# Patient Record
Sex: Male | Born: 2000 | Hispanic: No | Marital: Single | Smoking: Current every day smoker
Health system: Southern US, Community
[De-identification: ages and names within clinical notes are randomized; demographics above are authoritative.]

## PROBLEM LIST (undated history)

## (undated) DIAGNOSIS — L709 Acne, unspecified: Secondary | ICD-10-CM

## (undated) DIAGNOSIS — N200 Calculus of kidney: Secondary | ICD-10-CM

## (undated) HISTORY — DX: Acne, unspecified: L70.9

---
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
## 2020-03-27 ENCOUNTER — Ambulatory Visit (INDEPENDENT_AMBULATORY_CARE_PROVIDER_SITE_OTHER): Payer: Medicaid Other | Admitting: Otolaryngology

## 2021-12-02 IMAGING — US US EXTREM LOW*L* LIMITED
1 series · 14 of 19 positions shown · non-contrast
Comparison: CT 01/12/2019.

CLINICAL DATA: Painful "bulge" in the left groin region for 1 year

EXAM:
ULTRASOUND LEFT LOWER EXTREMITY LIMITED
TECHNIQUE: Ultrasound examination of the lower extremity soft tissues was
performed in the area of clinical concern.

[Series 1: us extrem low*left* limited · 19 acquisitions, 14 frames shown]
[im 1/19]
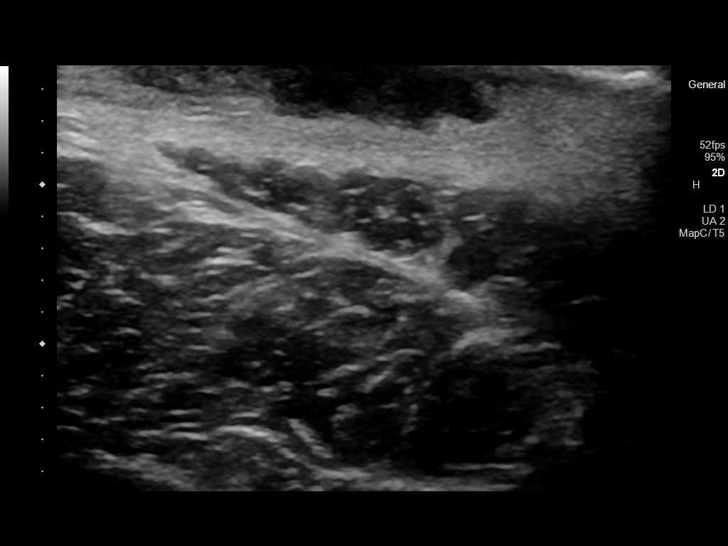
[im 3/19]
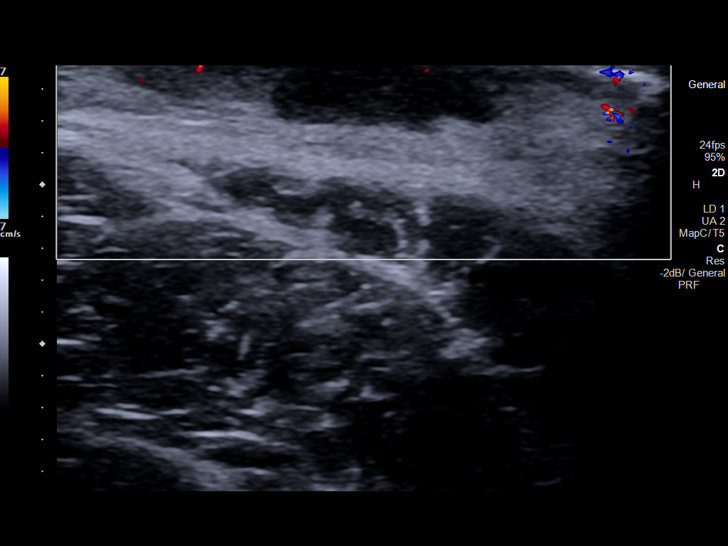
[im 4/19]
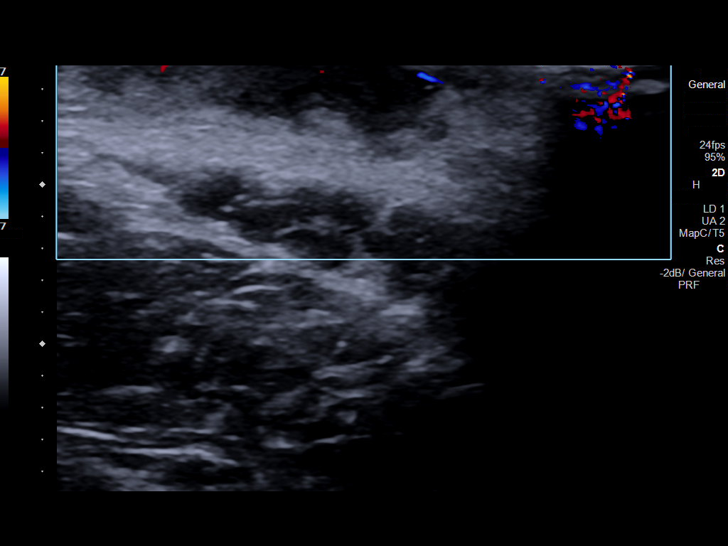
[im 5/19]
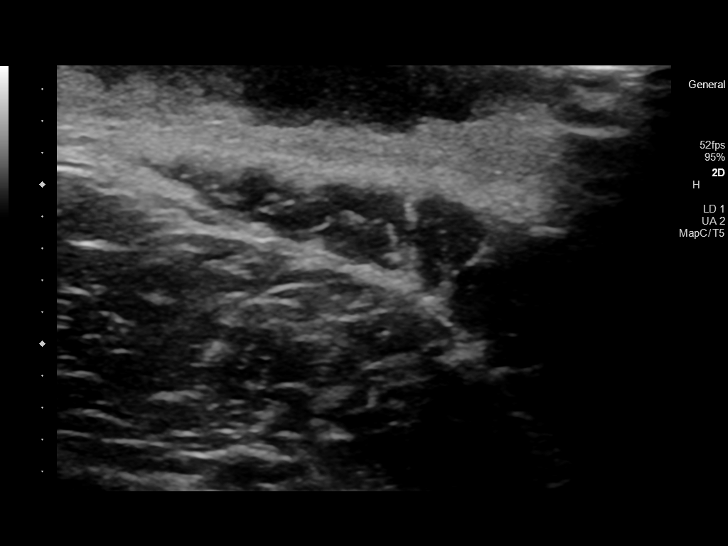
[im 7/19]
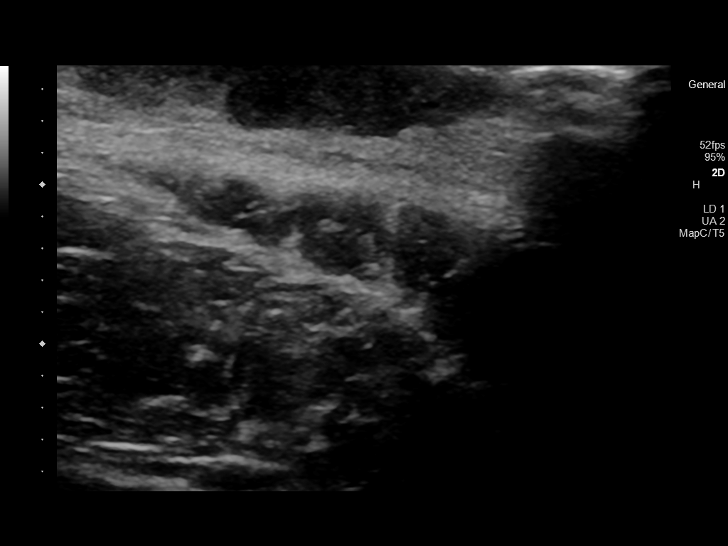
[im 8/19]
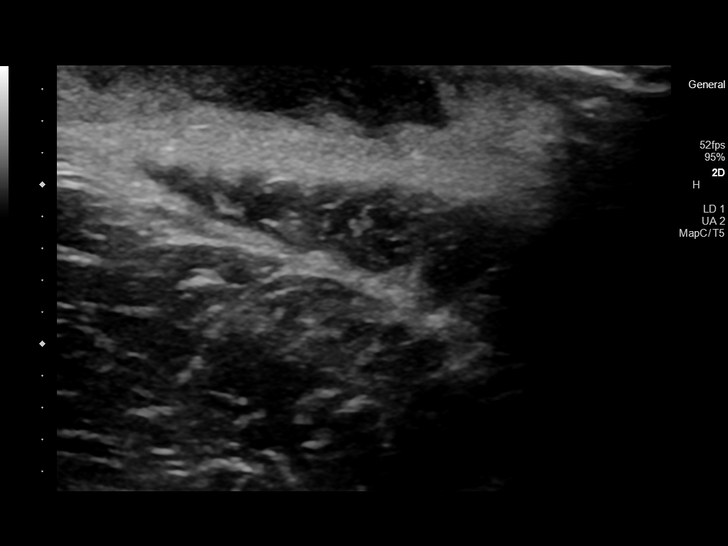
[im 9/19]
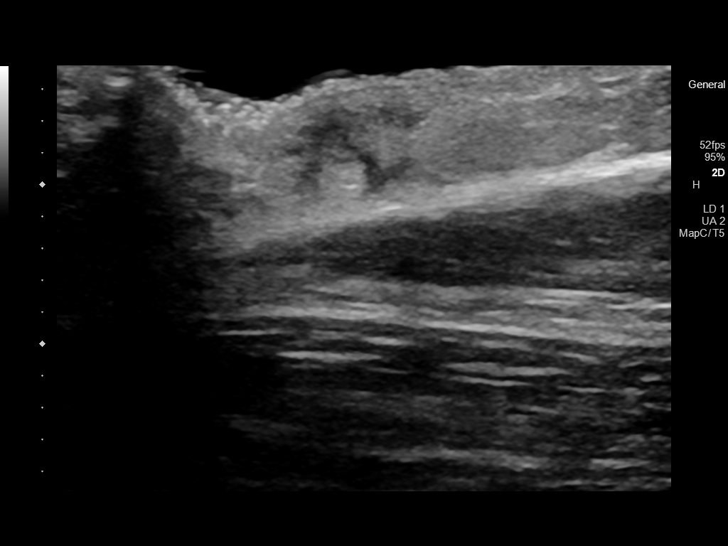
[im 11/19]
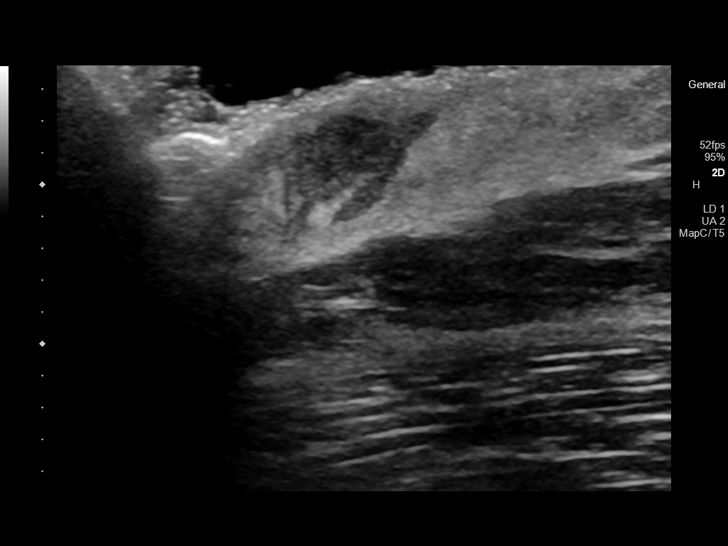
[im 12/19]
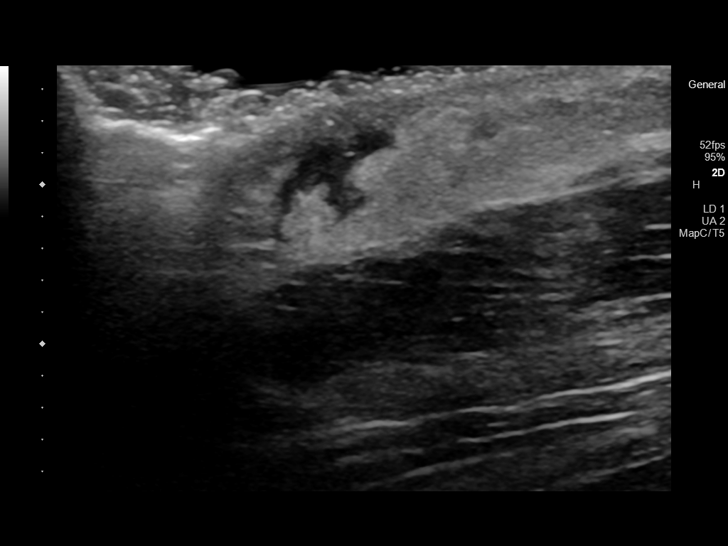
[im 13/19]
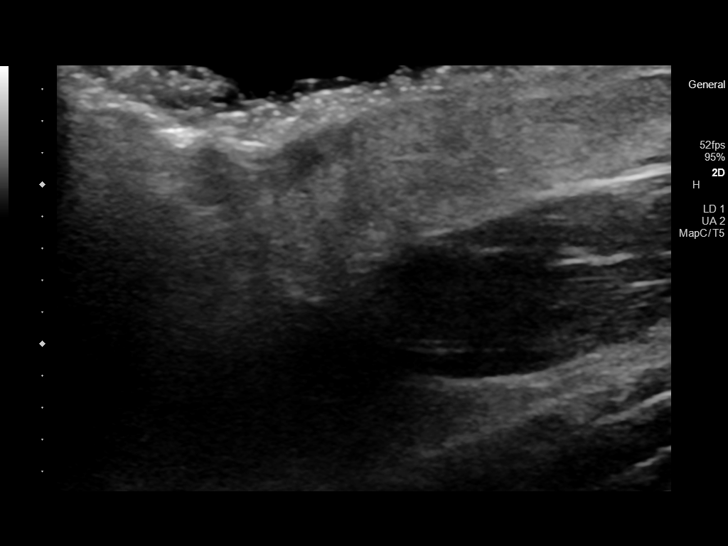
[im 15/19]
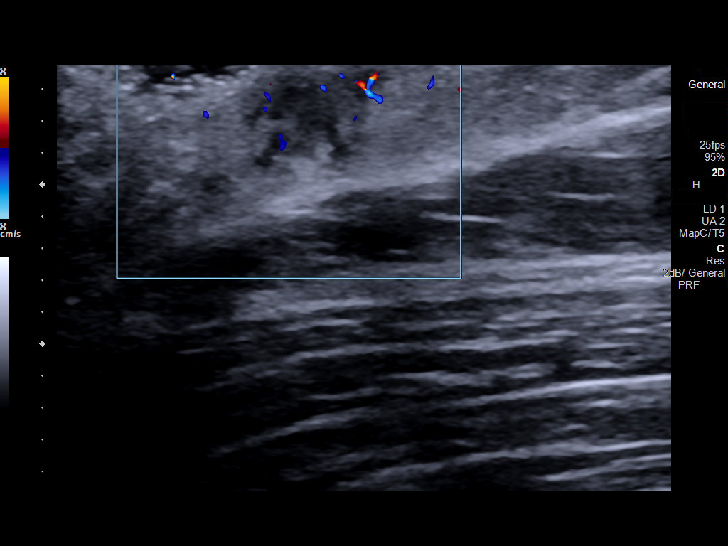
[im 16/19]
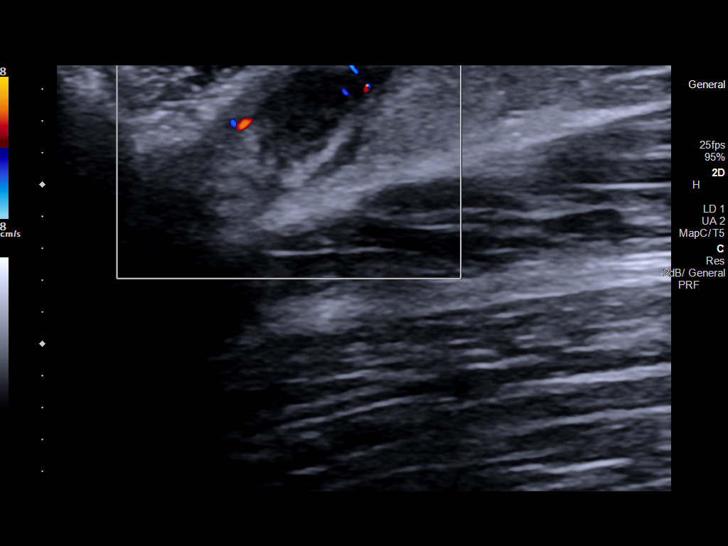
[im 17/19]
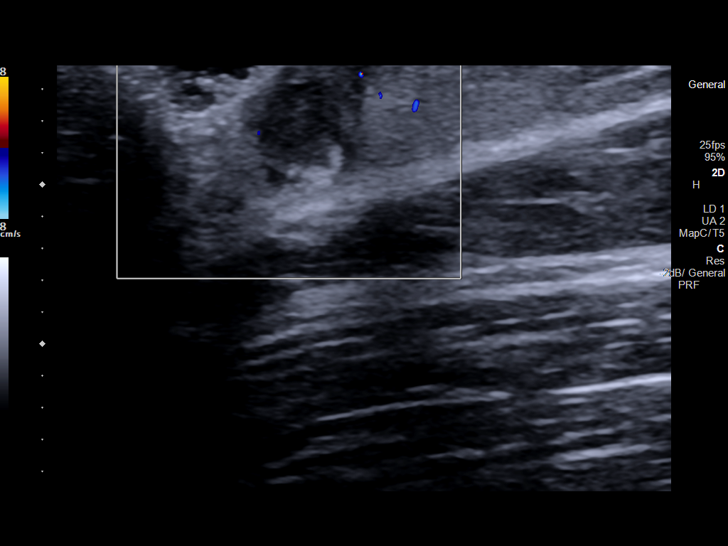
[im 19/19]
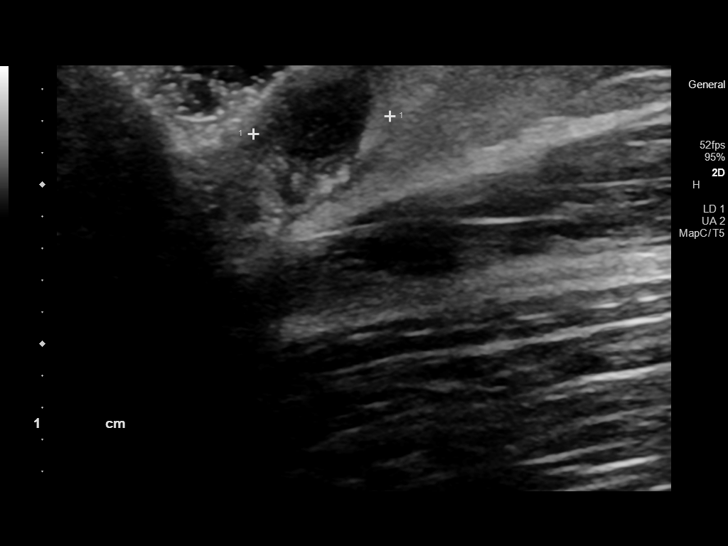

[14 of 19 positions shown; findings below may reference images not displayed]

FINDINGS: Targeted ultrasound was performed at site of patient's clinical
concern within the left inguinal region. Within the left inguinal
region is a somewhat ill-defined hypoechoic collection measuring
approximately 2.9 x 0.5 x 0.9 cm surrounding hyperemia.
IMPRESSION: Ill-defined complex fluid collection within the left inguinal region
measuring approximately 3 x 1 cm. Findings may represent phlegmonous
changes/developing abscess. Given the location and history,
lymphadenitis or a necrotic lymph node are also considerations.

## 2021-12-31 ENCOUNTER — Emergency Department (HOSPITAL_COMMUNITY)
Admission: EM | Admit: 2021-12-31 | Discharge: 2021-12-31 | Disposition: A | Discharge status: Home / Self Care | Payer: BC Managed Care – PPO | Attending: Emergency Medicine | Admitting: Emergency Medicine

## 2021-12-31 ENCOUNTER — Other Ambulatory Visit: Payer: Self-pay

## 2021-12-31 ENCOUNTER — Encounter (HOSPITAL_COMMUNITY): Payer: Self-pay | Admitting: Emergency Medicine

## 2021-12-31 DIAGNOSIS — M5432 Sciatica, left side: Secondary | ICD-10-CM

## 2021-12-31 DIAGNOSIS — M545 Low back pain, unspecified: Secondary | ICD-10-CM | POA: Insufficient documentation

## 2021-12-31 MED ORDER — DEXAMETHASONE SODIUM PHOSPHATE 10 MG/ML IJ SOLN
10.0000 mg | Freq: Once | INTRAMUSCULAR | Status: AC
  Administered 2021-12-31: 10 mg via INTRAVENOUS
  Filled 2021-12-31: qty 1

## 2021-12-31 MED ORDER — CELECOXIB 200 MG PO CAPS: 200.0000 mg | ORAL_CAPSULE | Freq: Two times a day (BID) | ORAL | 0 refills | Status: AC

## 2021-12-31 MED ORDER — KETOROLAC TROMETHAMINE 60 MG/2ML IM SOLN
60.0000 mg | Freq: Once | INTRAMUSCULAR | Status: DC
  Filled 2021-12-31: qty 2

## 2021-12-31 MED ORDER — METHYLPREDNISOLONE 4 MG PO TBPK: ORAL_TABLET | ORAL | 0 refills | Status: AC

## 2021-12-31 NOTE — Discharge Instructions (Addendum)
SEEK IMMEDIATE MEDICAL ATTENTION IF: New numbness, tingling, weakness, or problem with the use of your arms or legs.  Severe back pain not relieved with medications.  Change in bowel or bladder control.  Increasing pain in any areas of the body (such as chest or abdominal pain).  Shortness of breath, dizziness or fainting.  Nausea (feeling sick to your stomach), vomiting, fever, or sweats.  

## 2021-12-31 NOTE — ED Triage Notes (Signed)
Pt reports lower back pain & sciatica pain. Reports he has been out of work x 3 weeks and needs MRI to go back.

## 2021-12-31 NOTE — ED Provider Notes (Signed)
[facility]-EMERGENCY DEPT Provider Note   CSN: [id number] Arrival date & time: 12/31/21  1938     History  Chief Complaint  Patient presents with   Back Pain    Paul Wong is a 21 y.o. male.   Back Pain Location:  Lumbar spine Quality:  Shooting and aching Radiates to:  L posterior upper leg Pain severity:  Moderate Onset quality:  Gradual Duration:  5 weeks Timing:  Constant Progression:  Improving Context: lifting heavy objects   Relieved by:  Nothing Worsened by:  Ambulation Ineffective treatments: chiropractic adjustments. Associated symptoms: no bladder incontinence, no bowel incontinence, no numbness, no paresthesias and no weakness        Home Medications Prior to Admission medications   Medication Sig Start Date End Date Taking? Authorizing Provider  doxycycline (VIBRAMYCIN) 100 MG capsule Take 1 capsule (100 mg total) by mouth 2 (two) times daily. 10/06/19   Carroll Sage, PA-C  famotidine (PEPCID) 20 MG tablet Take 1 tablet (20 mg total) by mouth 2 (two) times daily. Take 2 times daily for 1 week, then take as needed 01/22/17 01/22/18  Minda Meo, MD  HYDROcodone-acetaminophen (NORCO/VICODIN) 5-325 MG tablet Take 1 tablet by mouth every 4 (four) hours as needed. 10/13/18   Jeannie Fend, PA-C  ondansetron (ZOFRAN ODT) 4 MG disintegrating tablet Take 1 tablet (4 mg total) by mouth every 8 (eight) hours as needed for nausea or vomiting. 01/12/19   Shanon Ace, PA-C      Allergies    Insect extract    Review of Systems   Review of Systems  Gastrointestinal:  Negative for bowel incontinence.  Genitourinary:  Negative for bladder incontinence.  Musculoskeletal:  Positive for back pain.  Neurological:  Negative for weakness, numbness and paresthesias.    Physical Exam Updated Vital Signs BP 121/83 (BP Location: Right Arm)   Pulse 89   Temp 99.3 F (37.4 C) (Oral)   Resp 17   SpO2 100%  Physical Exam Vitals  and nursing note reviewed.  Constitutional:      General: He is not in acute distress.    Appearance: He is well-developed. He is not diaphoretic.  HENT:     Head: Normocephalic and atraumatic.  Eyes:     General: No scleral icterus.    Conjunctiva/sclera: Conjunctivae normal.  Cardiovascular:     Rate and Rhythm: Normal rate and regular rhythm.     Heart sounds: Normal heart sounds.  Pulmonary:     Effort: Pulmonary effort is normal. No respiratory distress.     Breath sounds: Normal breath sounds.  Abdominal:     Palpations: Abdomen is soft.     Tenderness: There is no abdominal tenderness.  Musculoskeletal:     Cervical back: Normal range of motion and neck supple.     Comments: Patient appears to be in mild to moderate pain, antalgic gait noted. Lumbosacral spine area reveals no local tenderness or mass. Painful and reduced LS ROM noted. Straight leg raise is positive  left. DTR's, motor strength and sensation normal, including heel and toe gait.  Peripheral pulses are palpable.;  Skin:    General: Skin is warm and dry.  Neurological:     Mental Status: He is alert.  Psychiatric:        Behavior: Behavior normal.     ED Results / Procedures / Treatments   Labs (all labs ordered are listed, but only abnormal results are displayed) Labs Reviewed - No  data to display  EKG None  Radiology No results found.  Procedures Procedures    Medications Ordered in ED Medications - No data to display  ED Course/ Medical Decision Making/ A&P                           Medical Decision Making Patient with back pain.  No neurological deficits and normal neuro exam.  Patient can walk but states is painful.  No loss of bowel or bladder control.  No concern for cauda equina.  No fever, night sweats, weight loss, h/o cancer, IVDU.  RICE protocol and pain medicine indicated and discussed with patient.             Final Clinical Impression(s) / ED Diagnoses Final  diagnoses:  None    Rx / DC Orders ED Discharge Orders     None         Arthor Captain, PA-C 12/31/21 2011    Melene Plan, DO 12/31/21 2022

## 2022-01-01 ENCOUNTER — Emergency Department (HOSPITAL_COMMUNITY)
Admission: EM | Admit: 2022-01-01 | Discharge: 2022-01-01 | Discharge status: Against Medical Advice | Payer: BC Managed Care – PPO

## 2022-04-24 ENCOUNTER — Emergency Department (HOSPITAL_COMMUNITY): Payer: Medicaid Other

## 2022-04-24 ENCOUNTER — Emergency Department (HOSPITAL_COMMUNITY)
Admission: EM | Admit: 2022-04-24 | Discharge: 2022-04-24 | Disposition: A | Discharge status: Home / Self Care | Payer: Medicaid Other | Attending: Emergency Medicine | Admitting: Emergency Medicine

## 2022-04-24 ENCOUNTER — Encounter (HOSPITAL_COMMUNITY): Payer: Self-pay | Admitting: Emergency Medicine

## 2022-04-24 ENCOUNTER — Other Ambulatory Visit: Payer: Self-pay

## 2022-04-24 DIAGNOSIS — R091 Pleurisy: Secondary | ICD-10-CM | POA: Insufficient documentation

## 2022-04-24 DIAGNOSIS — R059 Cough, unspecified: Secondary | ICD-10-CM | POA: Diagnosis not present

## 2022-04-24 DIAGNOSIS — F17200 Nicotine dependence, unspecified, uncomplicated: Secondary | ICD-10-CM | POA: Diagnosis not present

## 2022-04-24 DIAGNOSIS — R079 Chest pain, unspecified: Secondary | ICD-10-CM | POA: Diagnosis present

## 2022-04-24 LAB — CBC
HCT: 42.8 % (ref 39.0–52.0)
Hemoglobin: 14.1 g/dL (ref 13.0–17.0)
MCH: 29.2 pg (ref 26.0–34.0)
MCHC: 32.9 g/dL (ref 30.0–36.0)
MCV: 88.6 fL (ref 80.0–100.0)
Platelets: 222 10*3/uL (ref 150–400)
RBC: 4.83 MIL/uL (ref 4.22–5.81)
RDW: 12.2 % (ref 11.5–15.5)
WBC: 5.5 10*3/uL (ref 4.0–10.5)
nRBC: 0 % (ref 0.0–0.2)

## 2022-04-24 LAB — BASIC METABOLIC PANEL
Anion gap: 9 (ref 5–15)
BUN: 11 mg/dL (ref 6–20)
CO2: 25 mmol/L (ref 22–32)
Calcium: 9.2 mg/dL (ref 8.9–10.3)
Chloride: 104 mmol/L (ref 98–111)
Creatinine, Ser: 1.01 mg/dL (ref 0.61–1.24)
GFR, Estimated: >60 mL/min (ref >60)
Glucose, Bld: 101 mg/dL — ABNORMAL HIGH (ref 70–99)
Potassium: 4.1 mmol/L (ref 3.5–5.1)
Sodium: 138 mmol/L (ref 135–145)

## 2022-04-24 LAB — TROPONIN I (HIGH SENSITIVITY): Troponin I (High Sensitivity): <2 ng/L (ref <18)

## 2022-04-24 MED ORDER — NAPROXEN 500 MG PO TABS: 500.0000 mg | ORAL_TABLET | Freq: Two times a day (BID) | ORAL | 0 refills | Status: AC

## 2022-04-24 MED ORDER — KETOROLAC TROMETHAMINE 15 MG/ML IJ SOLN
15.0000 mg | Freq: Once | INTRAMUSCULAR | Status: AC
  Administered 2022-04-24: 15 mg via INTRAVENOUS
  Filled 2022-04-24: qty 1

## 2022-04-24 NOTE — ED Triage Notes (Signed)
Patient arrives ambulatory by POV c/o left sided chest pain, left shoulder and arm pain since last night. Pain worse with movement.

## 2022-04-24 NOTE — ED Provider Notes (Signed)
[place] EMERGENCY DEPARTMENT AT [facility] Provider Note   CSN: [id number] Arrival date & time: 04/24/22  9528     History  Chief Complaint  Patient presents with   Chest Pain    Paul Wong is a 22 y.o. male.  Patient is a 22 year old male with a history of daily tobacco use who is presenting today with complaints of chest and arm pain.  Patient reports that symptoms started yesterday and initially were mild and worsened as the day went on.  Pain is pleuritic and worse with coughing, taking a deep breath and lifting his arms.  The pain is in the left side of his chest goes into his left shoulder and into his neck.  He has not had significant shortness of breath but has had a cough.  Has had some mucus production with the cough but no fevers.  He denies any abdominal pain or vomiting.  No syncope.  Patient reports that he has a strong family history of cardiac disease but he has not had any cardiac issues.  He does not get chest pain with exertion and no one in his family with sudden cardiac death in their 88s.  He lives an active lifestyle has had no recent hospitalizations, surgeries and no unilateral leg pain or swelling.  The history is provided by the patient.  Chest Pain      Home Medications Prior to Admission medications   Medication Sig Start Date End Date Taking? Authorizing Provider  naproxen (NAPROSYN) 500 MG tablet Take 1 tablet (500 mg total) by mouth 2 (two) times daily. 04/24/22  Yes Gwyneth Sprout, MD  celecoxib (CELEBREX) 200 MG capsule Take 1 capsule (200 mg total) by mouth 2 (two) times daily. 12/31/21   Arthor Captain, PA-C  doxycycline (VIBRAMYCIN) 100 MG capsule Take 1 capsule (100 mg total) by mouth 2 (two) times daily. 10/06/19   Carroll Sage, PA-C  famotidine (PEPCID) 20 MG tablet Take 1 tablet (20 mg total) by mouth 2 (two) times daily. Take 2 times daily for 1 week, then take as needed 01/22/17 01/22/18  Minda Meo, MD   HYDROcodone-acetaminophen (NORCO/VICODIN) 5-325 MG tablet Take 1 tablet by mouth every 4 (four) hours as needed. 10/13/18   Jeannie Fend, PA-C  methylPREDNISolone (MEDROL DOSEPAK) 4 MG TBPK tablet Use as directed 12/31/21   Arthor Captain, PA-C  ondansetron (ZOFRAN ODT) 4 MG disintegrating tablet Take 1 tablet (4 mg total) by mouth every 8 (eight) hours as needed for nausea or vomiting. 01/12/19   Shanon Ace, PA-C      Allergies    Insect extract    Review of Systems   Review of Systems  Cardiovascular:  Positive for chest pain.    Physical Exam Updated Vital Signs BP (!) 137/90   Pulse 97   Temp 98.9 F (37.2 C) (Oral)   Resp 16   Ht  (1.778 m)   Wt 74.8 kg   SpO2 100%   BMI 23.68 kg/m  Physical Exam Vitals and nursing note reviewed.  Constitutional:      General: He is not in acute distress.    Appearance: He is well-developed.  HENT:     Head: Normocephalic and atraumatic.  Eyes:     Conjunctiva/sclera: Conjunctivae normal.     Pupils: Pupils are equal, round, and reactive to light.  Cardiovascular:     Rate and Rhythm: Normal rate and regular rhythm.     Heart sounds: No murmur  heard. Pulmonary:     Effort: Pulmonary effort is normal. No respiratory distress.     Breath sounds: Normal breath sounds. No wheezing or rales.  Chest:     Chest wall: No tenderness.  Abdominal:     General: There is no distension.     Palpations: Abdomen is soft.     Tenderness: There is no abdominal tenderness. There is no guarding or rebound.  Musculoskeletal:        General: No tenderness. Normal range of motion.     Cervical back: Normal range of motion and neck supple.  Skin:    General: Skin is warm and dry.     Findings: No erythema or rash.  Neurological:     Mental Status: He is alert and oriented to person, place, and time.  Psychiatric:        Behavior: Behavior normal.     ED Results / Procedures / Treatments   Labs (all labs ordered are  listed, but only abnormal results are displayed) Labs Reviewed  BASIC METABOLIC PANEL - Abnormal; Notable for the following components:      Result Value   Glucose, Bld 101 (*)    All other components within normal limits  CBC  TROPONIN I (HIGH SENSITIVITY)  TROPONIN I (HIGH SENSITIVITY)    EKG EKG Interpretation  Date/Time:  Thursday April 24 2022 60:45:40 EDT Ventricular Rate:  85 PR Interval:  137 QRS Duration: 90 QT Interval:  350 QTC Calculation: 417 R Axis:   92 Text Interpretation: Sinus rhythm Borderline right axis deviation ST elev, probable normal early repol pattern No significant change since last tracing Confirmed by Gwyneth Sprout ([id number]) on 04/24/2022 10:08:19 AM  Radiology DG Chest 2 View  Result Date: 04/24/2022 CLINICAL DATA:  Chest pain. EXAM: CHEST - 2 VIEW COMPARISON:  01/22/2017. FINDINGS: Clear lungs. Normal heart size and mediastinal contours. No pleural effusion or pneumothorax. Visualized bones and upper abdomen are unremarkable. IMPRESSION: No evidence of acute cardiopulmonary disease. Electronically Signed   By: Orvan Falconer M.D.   On: 04/24/2022 09:47    Procedures Procedures    Medications Ordered in ED Medications  ketorolac (TORADOL) 15 MG/ML injection 15 mg (has no administration in time range)    ED Course/ Medical Decision Making/ A&P                             Medical Decision Making Amount and/or Complexity of Data Reviewed Labs: ordered. Decision-making details documented in ED Course. Radiology: ordered and independent interpretation performed. Decision-making details documented in ED Course. ECG/medicine tests: ordered and independent interpretation performed. Decision-making details documented in ED Course.  Risk Prescription drug management.   Pt presenting today with a complaint that caries a high risk for morbidity and mortality.  Here today with a complaint of pleuritic chest pain.  Patient is PERC negative and does  not have risk factors for PE.  He is a smoker and concern for most likely pleurisy as he has had some cough recently and some mucus production.  Lower suspicion for pneumonia.  Lower suspicion for ACS.  I independently interpreted patient's EKG which did show early repolarization but no other acute findings.  No findings to suggest pericarditis today.  Patient is well-appearing.  Oxygen saturation 100%.  I independently interpreted patient's labs BMP is normal, CBC within normal limits and troponin is normal at less than 2.  I have independently visualized and interpreted pt's  images today.  Chest x-ray within normal limits.  Will treat with anti-inflammatories.  Patient is not wheezing on exam which would require inhaler.  Will have him return for worsening symptoms.           Final Clinical Impression(s) / ED Diagnoses Final diagnoses:  Pleurisy    Rx / DC Orders ED Discharge Orders          Ordered    naproxen (NAPROSYN) 500 MG tablet  2 times daily        04/24/22 1127              Gwyneth Sprout, MD 04/24/22 1129

## 2022-04-24 NOTE — Discharge Instructions (Addendum)
The EKG and x-ray look good.  All your blood work was normal.  You most likely have pleurisy.  You should stop vaping and make sure you are staying away from insulation.  If you start having severe shortness of breath, high fevers return to the emergency room.  Usually takes about a week for symptoms to improve.

## 2022-06-25 ENCOUNTER — Emergency Department (HOSPITAL_COMMUNITY)
Admission: EM | Admit: 2022-06-25 | Discharge: 2022-06-25 | Disposition: A | Discharge status: Home / Self Care | Payer: Medicaid Other | Attending: Emergency Medicine | Admitting: Emergency Medicine

## 2022-06-25 ENCOUNTER — Encounter (HOSPITAL_COMMUNITY): Payer: Self-pay

## 2022-06-25 ENCOUNTER — Other Ambulatory Visit: Payer: Self-pay

## 2022-06-25 DIAGNOSIS — K641 Second degree hemorrhoids: Secondary | ICD-10-CM | POA: Insufficient documentation

## 2022-06-25 DIAGNOSIS — K6289 Other specified diseases of anus and rectum: Secondary | ICD-10-CM | POA: Diagnosis present

## 2022-06-25 DIAGNOSIS — L989 Disorder of the skin and subcutaneous tissue, unspecified: Secondary | ICD-10-CM

## 2022-06-25 DIAGNOSIS — R229 Localized swelling, mass and lump, unspecified: Secondary | ICD-10-CM | POA: Insufficient documentation

## 2022-06-25 DIAGNOSIS — Z113 Encounter for screening for infections with a predominantly sexual mode of transmission: Secondary | ICD-10-CM | POA: Diagnosis not present

## 2022-06-25 MED ORDER — DOXYCYCLINE HYCLATE 100 MG PO CAPS: 100.0000 mg | ORAL_CAPSULE | Freq: Two times a day (BID) | ORAL | 0 refills | Status: AC

## 2022-06-25 NOTE — Discharge Instructions (Addendum)
Please call the GI specialist have attached you for you for your hemorrhoids.  You may also take Preparation H over-the-counter.  I have also prescribed for you an antibiotic to take in case you do have a skin infection called doxycycline.  Please follow-up with the MyChart app regarding your STD screenings.  These results should come back in the next few days.  If symptoms were to change or worsen please return to ER.

## 2022-06-25 NOTE — ED Notes (Signed)
ED Provider at bedside. 

## 2022-06-25 NOTE — ED Triage Notes (Signed)
Rectal pain for last 2 days or so when sitting. Suspects hemorrhoids. Denies anything external.

## 2022-06-25 NOTE — ED Provider Notes (Signed)
[place] EMERGENCY DEPARTMENT AT [facility] Provider Note   CSN: [id number] Arrival date & time: 06/25/22  1928     History  Chief Complaint  Patient presents with   Rectal Pain    Paul Wong is a 22 y.o. male presented with multiple concerns.  Patient stated that he has had small "abscesses "for the past few years and is inner thigh that have been draining clear fluid.  Patient is been prescribed doxycycline in the past and feels that he needs antibiotics again.  Patient also notes that he has had rectal discomfort for the past few weeks and is concerned for hemorrhoids.  Patient does note he has history of anal intercourse but that was more than 6 months ago.  Patient also states that he would like to be tested for STDs as well including gonorrhea chlamydia syphilis and HIV.  Patient denied chest pain, shortness of breath, fevers, rectal discharge, history of HS, pruritic lesion, purulent discharge, penile lesions, dysuria, hematuria, low back pain, changes sensation/motor skills  Home Medications Prior to Admission medications   Medication Sig Start Date End Date Taking? Authorizing Provider  celecoxib (CELEBREX) 200 MG capsule Take 1 capsule (200 mg total) by mouth 2 (two) times daily. 12/31/21   Arthor Captain, PA-C  doxycycline (VIBRAMYCIN) 100 MG capsule Take 1 capsule (100 mg total) by mouth 2 (two) times daily. 06/25/22   Netta Corrigan, PA-C  famotidine (PEPCID) 20 MG tablet Take 1 tablet (20 mg total) by mouth 2 (two) times daily. Take 2 times daily for 1 week, then take as needed 01/22/17 01/22/18  Minda Meo, MD  HYDROcodone-acetaminophen (NORCO/VICODIN) 5-325 MG tablet Take 1 tablet by mouth every 4 (four) hours as needed. 10/13/18   Jeannie Fend, PA-C  methylPREDNISolone (MEDROL DOSEPAK) 4 MG TBPK tablet Use as directed 12/31/21   Arthor Captain, PA-C  naproxen (NAPROSYN) 500 MG tablet Take 1 tablet (500 mg total) by mouth 2 (two) times daily. 04/24/22    Gwyneth Sprout, MD  ondansetron (ZOFRAN ODT) 4 MG disintegrating tablet Take 1 tablet (4 mg total) by mouth every 8 (eight) hours as needed for nausea or vomiting. 01/12/19   Shanon Ace, PA-C      Allergies    Insect extract    Review of Systems   Review of Systems See HPI Physical Exam Updated Vital Signs BP (!) 150/92 (BP Location: Left Arm)   Pulse (!) 108   Temp 98.6 F (37 C) (Oral)   Resp 17   Ht 5\' 10"  (1.778 m)   Wt 72.6 kg   SpO2 100%   BMI 22.96 kg/m  Physical Exam Exam conducted with a chaperone present.  Constitutional:      General: He is not in acute distress. Cardiovascular:     Rate and Rhythm: Normal rate and regular rhythm.     Pulses: Normal pulses.  Pulmonary:     Effort: Pulmonary effort is normal. No respiratory distress.     Breath sounds: Normal breath sounds.  Abdominal:     Hernia: There is no hernia in the left inguinal area or right inguinal area.  Genitourinary:    Penis: Normal and circumcised. No erythema, tenderness, discharge, swelling or lesions.      Testes: Normal. Cremasteric reflex is present.        Right: Mass, tenderness, swelling, testicular hydrocele or varicocele not present. Right testis is descended. Cremasteric reflex is present.         Left:  Mass, tenderness, swelling, testicular hydrocele or varicocele not present. Left testis is descended. Cremasteric reflex is present.      Epididymis:     Right: Normal.     Left: Normal.     Comments: Chaperone:Johnson, Juanique A, RN Reducible hemorrhoid, no signs of strangulated hemorrhoid Lymphadenopathy:     Lower Body: No right inguinal adenopathy. No left inguinal adenopathy.  Skin:    General: Skin is warm and dry.     Comments: Bilateral medial thigh: Small papules without surrounding erythema, warmth, area of fluctuance, discharge No signs of Fournier's gangrene or necrotizing fasciitis  Neurological:     Mental Status: He is alert.     ED Results /  Procedures / Treatments   Labs (all labs ordered are listed, but only abnormal results are displayed) Labs Reviewed  HIV ANTIBODY (ROUTINE TESTING W REFLEX)  RPR  URINE CYTOLOGY ANCILLARY ONLY    EKG None  Radiology No results found.  Procedures Procedures    Medications Ordered in ED Medications - No data to display  ED Course/ Medical Decision Making/ A&P                             Medical Decision Making  [facility] 22 y.o. presented today for rectal discomfort, skin lesions, STD screening. Working DDx that I considered at this time includes, but not limited to, hemorrhoids, proctitis, gonorrhea/chlamydia, syphilis, HIV, furuncle/carbuncle, papules, cellulitis, necrotizing fasciitis, Fournier's gangrene.  R/o DDx: proctitis, gonorrhea/chlamydia, syphilis, HIV, furuncle/carbuncle, cellulitis, necrotizing fasciitis, Fournier's gangrene: These are considered less likely due to history of present illness and physical exam findings  Review of prior external notes: 04/24/2022 ED  Unique Tests and My Interpretation:  RPR: Pending Urine cytology: Pending HIV antibody: Pending  Discussion with Independent Historian: None  Discussion of Management of Tests: None  Risk: Medium: prescription drug management  Risk Stratification Score: None  Plan: Patient presented for multiple concerns. On exam patient was in no acute distress and stable vitals.  EMR shows that patient was tachycardic at 108 however during my exam patient was not tachycardic.  Patient stated that he was having rectal discomfort and skin lesions and bilateral groin.  With a chaperone in the room I examined the patient and noted some papules bilaterally and medial thighs however no areas of fluctuance, erythema, warmth, discharge were noted.  Patient states in the past he has been given doxycycline which seems to help with the skin lesions.  When talking to the patient I agreed to give him doxycycline to  cover for any possible skin infection.  At this time I do not see any signs of abscess or indications for I&D.  For the patient's rectal exam a hemorrhoid was noted that did not show any signs of strangulation and could be easily reducible.  Patient will be given GI follow-up for his hemorrhoid and encouraged take Preparation H over-the-counter as needed.  Patient did state they want to be tested for gonorrhea chlamydia, syphilis, HIV and so those labs to be drawn and I encouraged patient to follow-up with his results on MyChart app is stable to be few days to result.  Patient be given primary care follow-up for long-term management.  Patient was given return precautions. Patient stable for discharge at this time.  Patient verbalized understanding of plan.         Final Clinical Impression(s) / ED Diagnoses Final diagnoses:  Grade II hemorrhoids  Screening  examination for STI  Bumps on skin    Rx / DC Orders ED Discharge Orders          Ordered    doxycycline (VIBRAMYCIN) 100 MG capsule  2 times daily        06/25/22 2140              Remi Deter 06/25/22 2203    Tegeler, Canary Brim, MD 06/25/22 2340

## 2022-06-26 LAB — RPR: RPR Ser Ql: NONREACTIVE

## 2022-06-26 LAB — HIV ANTIBODY (ROUTINE TESTING W REFLEX): HIV Screen 4th Generation wRfx: NONREACTIVE

## 2022-06-30 ENCOUNTER — Emergency Department (HOSPITAL_COMMUNITY)
Admission: EM | Admit: 2022-06-30 | Discharge: 2022-07-01 | Disposition: A | Discharge status: Home / Self Care | Payer: Medicaid Other | Attending: Emergency Medicine | Admitting: Emergency Medicine

## 2022-06-30 ENCOUNTER — Encounter (HOSPITAL_COMMUNITY): Payer: Self-pay

## 2022-06-30 ENCOUNTER — Other Ambulatory Visit: Payer: Self-pay

## 2022-06-30 DIAGNOSIS — Z711 Person with feared health complaint in whom no diagnosis is made: Secondary | ICD-10-CM | POA: Insufficient documentation

## 2022-06-30 LAB — URINALYSIS, ROUTINE W REFLEX MICROSCOPIC
Bacteria, UA: NONE SEEN
Bilirubin Urine: NEGATIVE
Glucose, UA: NEGATIVE mg/dL
Ketones, ur: NEGATIVE mg/dL
Leukocytes,Ua: NEGATIVE
Nitrite: NEGATIVE
Protein, ur: NEGATIVE mg/dL
Specific Gravity, Urine: 1.015 (ref 1.005–1.030)
pH: 5 (ref 5.0–8.0)

## 2022-06-30 NOTE — ED Triage Notes (Signed)
Patient reports he wants to get checked for STD's, unsure if he was exposed. States he was here last week for hemorrhoids and wants to get those checked out again as well. Denies any symptoms currently besides being uncomfortable from hemorrhoids.

## 2022-06-30 NOTE — ED Notes (Signed)
Pt refuse blood draw state he just had blood draw for STD check. PT only gave urine sample while in triage

## 2022-07-01 ENCOUNTER — Encounter (HOSPITAL_COMMUNITY): Payer: Self-pay | Admitting: Emergency Medicine

## 2022-07-01 NOTE — ED Provider Notes (Signed)
EMERGENCY DEPARTMENT AT [facility] Provider Note   CSN: [id number] Arrival date & time: 06/30/22  2200     History  Chief Complaint  Patient presents with   Exposure to STD   Hemorrhoids    Paul Wong is a 22 y.o. male.  The history is provided by the patient and medical records. No language interpreter was used.  Exposure to STD     22 year old male presenting with concerns of potential HPV.  Patient states 6 months ago he had unprotected anal sex with another partner and since then he has been worried about potential sexual transmitted infection.  He was initially seen and evaluated a week ago for his concerns and at that time he was told that he had a hemorrhoid and he was also was given doxycycline for any potential infection.  He returns today requesting for evaluation for potential HPV.  He denies any significant rectal discomfort no fever no abdominal pain no new rash.  Denies any new sexual intercourse.  Home Medications Prior to Admission medications   Medication Sig Start Date End Date Taking? Authorizing Provider  celecoxib (CELEBREX) 200 MG capsule Take 1 capsule (200 mg total) by mouth 2 (two) times daily. 12/31/21   Arthor Captain, PA-C  doxycycline (VIBRAMYCIN) 100 MG capsule Take 1 capsule (100 mg total) by mouth 2 (two) times daily. 06/25/22   Netta Corrigan, PA-C  famotidine (PEPCID) 20 MG tablet Take 1 tablet (20 mg total) by mouth 2 (two) times daily. Take 2 times daily for 1 week, then take as needed 01/22/17 01/22/18  Minda Meo, MD  HYDROcodone-acetaminophen (NORCO/VICODIN) 5-325 MG tablet Take 1 tablet by mouth every 4 (four) hours as needed. 10/13/18   Jeannie Fend, PA-C  methylPREDNISolone (MEDROL DOSEPAK) 4 MG TBPK tablet Use as directed 12/31/21   Arthor Captain, PA-C  naproxen (NAPROSYN) 500 MG tablet Take 1 tablet (500 mg total) by mouth 2 (two) times daily. 04/24/22   Gwyneth Sprout, MD  ondansetron (ZOFRAN ODT) 4 MG  disintegrating tablet Take 1 tablet (4 mg total) by mouth every 8 (eight) hours as needed for nausea or vomiting. 01/12/19   Shanon Ace, PA-C      Allergies    Insect extract    Review of Systems   Review of Systems  All other systems reviewed and are negative.   Physical Exam Updated Vital Signs BP 129/76 (BP Location: Left Arm)   Pulse (!) 57   Temp 97.8 F (36.6 C) (Oral)   Resp 18   Ht 5\' 10"  (1.778 m)   Wt 72.6 kg   SpO2 100%   BMI 22.96 kg/m  Physical Exam Vitals and nursing note reviewed.  Constitutional:      General: He is not in acute distress.    Appearance: He is well-developed.  HENT:     Head: Atraumatic.  Eyes:     Conjunctiva/sclera: Conjunctivae normal.  Abdominal:     Palpations: Abdomen is soft.     Tenderness: There is no abdominal tenderness.  Genitourinary:    Comments: Chaperone present during exam.  Normal rectal tone no obvious mass no thrombosed hemorrhoid no anal fissure no stool on glove no evidence of HPV. Musculoskeletal:     Cervical back: Neck supple.  Skin:    Findings: No rash.  Neurological:     Mental Status: He is alert.     ED Results / Procedures / Treatments   Labs (all labs ordered are  listed, but only abnormal results are displayed) Labs Reviewed  URINALYSIS, ROUTINE W REFLEX MICROSCOPIC - Abnormal; Notable for the following components:      Result Value   Color, Urine STRAW (*)    Hgb urine dipstick SMALL (*)    All other components within normal limits  RPR  HIV ANTIBODY (ROUTINE TESTING W REFLEX)  GC/CHLAMYDIA PROBE AMP ([place]) NOT AT [facility]    EKG None  Radiology No results found.  Procedures Procedures    Medications Ordered in ED Medications - No data to display  ED Course/ Medical Decision Making/ A&P                             Medical Decision Making Amount and/or Complexity of Data Reviewed Labs: ordered.   BP 129/76 (BP Location: Left Arm)   Pulse (!) 57   Temp 97.8  F (36.6 C) (Oral)   Resp 18   Ht 5\' 10"  (1.778 m)   Wt 72.6 kg   SpO2 100%   BMI 22.96 kg/m   48:26 AM  23 year old male presenting with concerns of potential HPV.  Patient states 6 months ago he had unprotected anal sex with another partner and since then he has been worried about potential sexual transmitted infection.  He was initially seen and evaluated a week ago for his concerns and at that time he was told that he had a hemorrhoid and he was also was given doxycycline for any potential infection.  He returns today requesting for evaluation for potential HPV.  He denies any significant rectal discomfort no fever no abdominal pain no new rash.  Denies any new sexual intercourse.  On exam this is a well-appearing male resting comfortably appears to be in no acute discomfort.  Rectal exam performed with chaperone present.  No concerning findings were noted.  No evidence to suggest ongoing HPV, no thrombosed hemorrhoid no anal fissure or abnormal mass.  Reassurance given.  Encourage patient to follow-up with PCP for outpatient evaluation.  Recommend safe sex practices and I gave patient return precaution.  EMR review and consider in my decision-making. Urinalysis obtained today without any signs of infection.  DDx: HPV, HIV, anal fissure, rectal mass, hemorrhoid.        Final Clinical Impression(s) / ED Diagnoses Final diagnoses:  Concern about STD in male without diagnosis    Rx / DC Orders ED Discharge Orders     None         Fayrene Helper, PA-C 07/01/22 0350    Tilden Fossa, MD 07/01/22 2253

## 2022-07-02 LAB — GC/CHLAMYDIA PROBE AMP (~~LOC~~) NOT AT ARMC
Chlamydia: NEGATIVE
Comment: NEGATIVE
Comment: NORMAL
Neisseria Gonorrhea: NEGATIVE

## 2022-07-04 ENCOUNTER — Ambulatory Visit: Payer: Self-pay

## 2022-07-04 NOTE — Telephone Encounter (Signed)
Message from Valora Piccolo sent at 07/04/2022  4:42 PM EDT  Summary: Advise on the skin Penis & HPV Testing   Pt is calling to ask questions about additional skin on the tip of his penis. Pt would like ask is there an HPV for males? Please advise         Chief Complaint: samll area of red raised area  Symptoms: tingling to the area Frequency: today  Pertinent Negatives: Patient denies pain, urinary burning, fever Disposition: [] ED /[] Urgent Care (no appt availability in office) / [] Appointment(In office/virtual)/ []  [facility]/ [x] Home Care/ [] Refused Recommended Disposition /[] [facility]/ []  Follow-up with PCP Additional Notes: pt very concerned about what the area is. Advised pt to watch it to see if it enlarges, to try an keep his hands off of it. To go to UC if concerned or worsens. Advised pt he needs a PCP, refused appt - concerned about billing- gave ot billing office number.   Reason for Disposition  Caller is uncertain what lesion is  Answer Assessment - Initial Assessment Questions 1. APPEARANCE of LESION: "What does it look like?"      Red and barley visible 2. SIZE: "How big is it?" (e.g., inches, cm; or compare to size of pinhead, tip of pen, eraser, coin, pea, grape, ping pong ball)      Very small red and at the urethral meatus  3. COLOR: "What color is it?" "Is there more than one color?"     red 4. SHAPE: "What shape is it?" (e.g., round, irregular)     Oval  5. RAISED: "Does it stick up above the skin or is it flat?" (e.g., raised or elevated)     raised 6. TENDER: "Does it hurt when you touch it?"  (Scale 1-10; or mild, moderate, severe)     no 7. LOCATION: "Where is it located?"      Urethral meatus 8. ONSET: "When did it first appear?"      Today  9. NUMBER: "Is there just one?" or "Are there others?"     1 10. CAUSE: "What do you think it is?"       Worried HPV  11. OTHER SYMPTOMS: "Do you have any other symptoms?" (e.g.,  fever)       no  Protocols used: Skin Lesion - Moles or Growths-A-AH

## 2022-07-06 ENCOUNTER — Other Ambulatory Visit: Payer: Self-pay

## 2022-07-06 ENCOUNTER — Encounter (HOSPITAL_COMMUNITY): Payer: Self-pay | Admitting: Emergency Medicine

## 2022-07-06 ENCOUNTER — Emergency Department (HOSPITAL_COMMUNITY)
Admission: EM | Admit: 2022-07-06 | Discharge: 2022-07-06 | Disposition: A | Discharge status: Home / Self Care | Payer: Medicaid Other | Attending: Emergency Medicine | Admitting: Emergency Medicine

## 2022-07-06 DIAGNOSIS — N489 Disorder of penis, unspecified: Secondary | ICD-10-CM | POA: Diagnosis present

## 2022-07-06 DIAGNOSIS — K644 Residual hemorrhoidal skin tags: Secondary | ICD-10-CM | POA: Insufficient documentation

## 2022-07-06 MED ORDER — NIFEDIPINE 0.3 % OINTMENT: 1.0000 | TOPICAL_OINTMENT | Freq: Four times a day (QID) | CUTANEOUS | 0 refills | Status: AC

## 2022-07-06 NOTE — ED Provider Notes (Signed)
[place] EMERGENCY DEPARTMENT AT [facility] Provider Note   CSN: [id number] Arrival date & time: 07/06/22  1914     History    Paul Wong is a 22 y.o. male who presents to the ED complaining of a bump on the tip of his penis that he noticed about 3 days ago.  States that the area feels irritated but not specifically painful or itchy.  No penile discharge.  No fever, abdominal pain, nausea, or vomiting.  States that he has not been sexually active in at least 6 months.  Was recently evaluated approximately 5 days ago for this with STD testing which was negative.  At that time was started on doxycycline for abscesses to both of his thighs which are improving. At initial evaluation, did not have spot on penis however. No new sexual contacts. No history of previous STD. Also complains of hemorrhoids. Using over the counter prep H with minimal improvement.     Home Medications Prior to Admission medications   Medication Sig Start Date End Date Taking? Authorizing Provider  nifedipine 0.3 % ointment Place 1 Application rectally 4 (four) times daily for 7 days. 07/06/22 07/13/22 Yes Yul Diana L, PA-C  celecoxib (CELEBREX) 200 MG capsule Take 1 capsule (200 mg total) by mouth 2 (two) times daily. 12/31/21   Arthor Captain, PA-C  doxycycline (VIBRAMYCIN) 100 MG capsule Take 1 capsule (100 mg total) by mouth 2 (two) times daily. 06/25/22   Netta Corrigan, PA-C  famotidine (PEPCID) 20 MG tablet Take 1 tablet (20 mg total) by mouth 2 (two) times daily. Take 2 times daily for 1 week, then take as needed 01/22/17 01/22/18  Minda Meo, MD  HYDROcodone-acetaminophen (NORCO/VICODIN) 5-325 MG tablet Take 1 tablet by mouth every 4 (four) hours as needed. 10/13/18   Jeannie Fend, PA-C  methylPREDNISolone (MEDROL DOSEPAK) 4 MG TBPK tablet Use as directed 12/31/21   Arthor Captain, PA-C  naproxen (NAPROSYN) 500 MG tablet Take 1 tablet (500 mg total) by mouth 2 (two) times daily. 04/24/22    Gwyneth Sprout, MD  ondansetron (ZOFRAN ODT) 4 MG disintegrating tablet Take 1 tablet (4 mg total) by mouth every 8 (eight) hours as needed for nausea or vomiting. 01/12/19   Shanon Ace, PA-C      Allergies    Insect extract and Oxycodone    Review of Systems   Review of Systems  All other systems reviewed and are negative.   Physical Exam Updated Vital Signs BP 117/78 (BP Location: Right Arm)   Pulse 75   Temp 98.2 F (36.8 C) (Oral)   Resp 18   Ht 5\' 10"  (1.778 m)   Wt 72.6 kg   SpO2 95%   BMI 22.96 kg/m  Physical Exam Vitals and nursing note reviewed. Exam conducted with a chaperone present.  Constitutional:      General: He is not in acute distress.    Appearance: Normal appearance.  HENT:     Head: Normocephalic and atraumatic.     Mouth/Throat:     Mouth: Mucous membranes are moist.  Eyes:     Conjunctiva/sclera: Conjunctivae normal.  Cardiovascular:     Rate and Rhythm: Normal rate and regular rhythm.     Heart sounds: No murmur heard. Pulmonary:     Effort: Pulmonary effort is normal.     Breath sounds: Normal breath sounds.  Abdominal:     General: Abdomen is flat.     Palpations: Abdomen is soft.  Tenderness: There is no abdominal tenderness. There is no guarding or rebound.  Genitourinary:    Penis: Lesions (very small area of slight erythema and induration at tip of penis, nontender) present.      Testes: Normal.        Right: Mass or tenderness not present.        Left: Mass or tenderness not present.     Rectum: External hemorrhoid (nonthrombosed) present. Normal anal tone.     Comments: [name], RN, present as chaperone Musculoskeletal:        General: Normal range of motion.     Cervical back: Neck supple.     Right lower leg: No edema.     Left lower leg: No edema.     Comments: Pinpoint raised lesions to inner thighs bilaterally with slight erythema and increased warmth  Lymphadenopathy:     Lower Body: No right inguinal  adenopathy. No left inguinal adenopathy.  Skin:    General: Skin is warm and dry.     Capillary Refill: Capillary refill takes less than 2 seconds.  Neurological:     Mental Status: He is alert. Mental status is at baseline.  Psychiatric:        Behavior: Behavior normal.     ED Results / Procedures / Treatments   Labs (all labs ordered are listed, but only abnormal results are displayed) Labs Reviewed - No data to display  EKG None  Radiology No results found.  Procedures Procedures    Medications Ordered in ED Medications - No data to display  ED Course/ Medical Decision Making/ A&P                             Medical Decision Making  Medical Decision Making:   [name] is a 22 y.o. male who presented to the ED today with penile lesion detailed above.    Patient's presentation is complicated by their history of current antibiotic use.  Complete initial physical exam performed, notably the patient was in NAD. Slight area of erythema and induration to penis as above, no discharge.    Reviewed and confirmed nursing documentation for past medical history, family history, social history.    Initial Assessment:   With the patient's presentation, differential diagnosis includes but is not limited to STD, cellulitis, abscess, malignancy.  This is most consistent with an acute complicated illness  Initial Plan:  Objective evaluation as below reviewed   Final Assessment and Plan:   22 year old male presents to the ED for evaluation of penile lesion.  He states that this started 3 days ago.  Recently seen for abscesses to both of his thighs with negative STD panel.  No new sexual encounters.  He was started on doxycycline for these abscesses and they are improving. No evidence of balanitis. No associated pain, low suspicion for torsion, epididymitis, or other pelvic emergency. Discussed with pt recommendation for continuing doxycycline and close follow up with urology for  further evaluation including rule out malignancy.  Patient stressed understanding and agreement with plan.  Strict ED return precautions given, all questions answered, and stable for discharge.   Clinical Impression:  1. Penile lesion   2. External hemorrhoids without complication      Discharge           Final Clinical Impression(s) / ED Diagnoses Final diagnoses:  Penile lesion  External hemorrhoids without complication    Rx / DC Orders ED  Discharge Orders          Ordered    nifedipine 0.3 % ointment  4 times daily        07/06/22 2014              Richardson Dopp 07/06/22 2303    Jacalyn Lefevre, MD 07/06/22 2337

## 2022-07-06 NOTE — ED Triage Notes (Signed)
Presents for red bump on tip of penis that started 3 days ago. States area is irritated.   Denies penile discharge, fevers, urinary symptoms.   Has not been sexually active in last 6 months.

## 2022-07-06 NOTE — Discharge Instructions (Signed)
Thank you for letting us take care of you today.  Your previous testing for syphilis, HIV, gonorrhea and chlamydia were negative.  Continue the doxycycline previously prescribed for treatment of your abscesses.  With the new lesion, I do recommend following up with urology for further assessment.  I provided the information for the on-call urologist that we had today.  Please call his office to schedule a follow-up appointment as soon as possible.  I also provided 2 primary care clinics that you may establish care with.  You do have multiple external hemorrhoids today and did not appear to have any complications.  I prescribed an ointment to help with this.  For new or worsening symptoms such as fever, shortness of breath, back pain, burning with urination, blood in the urine, abdominal pain, or other new, concerning symptoms, return to the nearest ED for reevaluation.

## 2022-07-21 ENCOUNTER — Other Ambulatory Visit (HOSPITAL_COMMUNITY): Payer: Self-pay

## 2024-02-08 ENCOUNTER — Ambulatory Visit (HOSPITAL_COMMUNITY)
Admission: EM | Admit: 2024-02-08 | Discharge: 2024-02-08 | Disposition: A | Discharge status: Home / Self Care | Source: Home / Self Care

## 2024-02-08 DIAGNOSIS — F32A Depression, unspecified: Secondary | ICD-10-CM

## 2024-02-08 NOTE — Progress Notes (Signed)
" °   02/08/24 1700  [facility] Triage Screening (Walk-ins at [facility] only)  How Did You Hear About Us ? Self  What Is the Reason for Your Visit/Call Today? Patient is a 24 y.o. male with no psychiatric hx who presents seeking therapy. He states he has been dealing with on and off bouts of depression and he would like to take my mental health more seriously and start treatment.  He would like to start by seeing a therapist.  He works full time, however does not have insurance.  He states he will be applying for Medicaid.  Patient denies SI, HI, AVH or SA hx.  He would like outpatient referral information for therapists, stating he is in the process of applying for Medicaid.  How Long Has This Been Causing You Problems? 1-6 months  Have You Recently Had Any Thoughts About Hurting Yourself? No  Are You Planning to Commit Suicide/Harm Yourself At This time? No  Have you Recently Had Thoughts About Hurting Someone Sherral? No  Are You Planning To Harm Someone At This Time? No  Physical Abuse Denies  Verbal Abuse Denies  Sexual Abuse Denies  Exploitation of patient/patient's resources Denies  Self-Neglect Denies  Possible abuse reported to:  (n/a)  Are you currently experiencing any auditory, visual or other hallucinations? No  Have You Used Any Alcohol or Drugs in the Past 24 Hours? No  Do you have any current medical co-morbidities that require immediate attention? No  Clinician description of patient physical appearance/behavior: Patient is calm, cooperative, pleasant AAOx5  What Do You Feel Would Help You the Most Today? Treatment for Depression or other mood problem  If access to [facility] was not available, would you have sought care in the Emergency Department? No  Determination of Need Routine (7 days)  Options For Referral Outpatient Therapy    "

## 2024-02-08 NOTE — BH Assessment (Incomplete)
 SABRA
